# Patient Record
Sex: Female | Born: 2000 | Race: Asian | Hispanic: No | Marital: Single | Smoking: Never smoker
Health system: Southern US, Community
[De-identification: ages and names within clinical notes are randomized; demographics above are authoritative.]

## PROBLEM LIST (undated history)

## (undated) HISTORY — PX: WISDOM TOOTH EXTRACTION: SHX21

---
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
## 2022-04-10 ENCOUNTER — Emergency Department (HOSPITAL_BASED_OUTPATIENT_CLINIC_OR_DEPARTMENT_OTHER)
Admission: EM | Admit: 2022-04-10 | Discharge: 2022-04-10 | Disposition: A | Discharge status: Home / Self Care | Payer: Medicaid Other | Attending: Emergency Medicine | Admitting: Emergency Medicine

## 2022-04-10 ENCOUNTER — Other Ambulatory Visit: Payer: Self-pay

## 2022-04-10 ENCOUNTER — Emergency Department (HOSPITAL_BASED_OUTPATIENT_CLINIC_OR_DEPARTMENT_OTHER): Payer: Medicaid Other

## 2022-04-10 ENCOUNTER — Encounter (HOSPITAL_BASED_OUTPATIENT_CLINIC_OR_DEPARTMENT_OTHER): Payer: Self-pay | Admitting: Emergency Medicine

## 2022-04-10 DIAGNOSIS — R002 Palpitations: Secondary | ICD-10-CM | POA: Diagnosis not present

## 2022-04-10 DIAGNOSIS — R079 Chest pain, unspecified: Secondary | ICD-10-CM | POA: Diagnosis present

## 2022-04-10 DIAGNOSIS — R0789 Other chest pain: Secondary | ICD-10-CM | POA: Diagnosis not present

## 2022-04-10 NOTE — ED Provider Notes (Signed)
[facility] EMERGENCY DEPARTMENT Provider Note   CSN: [id number] Arrival date & time: 04/10/22  0805     History  Chief Complaint  Patient presents with   Chest Pain    Jessica Beltran is a 22 y.o. female.  Patient here with chest pain and palpitations.  History of the same.  History of anxiety.  Nothing makes it worse or better.  Denies any recent surgery or travel.  Not on birth controls pills.  Has already been seen by cardiology for issues like this in the past.  Has had negative stress test.  The history is provided by the patient.       Home Medications Prior to Admission medications   Medication Sig Start Date End Date Taking? Authorizing Provider  amitriptyline (ELAVIL) 10 MG tablet Take 1 tablet (10 mg total) by mouth at bedtime. 07/04/17 12/31/17  Kandis Ban, MD  dicyclomine (BENTYL) 20 MG tablet Take 1 tablet (20 mg total) by mouth every 12 (twelve) hours as needed (abdominal cramping). 01/09/16   Antonietta Breach, PA-C  famotidine (PEPCID) 10 MG tablet Take 1 tablet (10 mg total) by mouth daily. 10/11/21   Azucena Cecil, PA-C  ondansetron (ZOFRAN) 4 MG tablet Take 1 tablet (4 mg total) by mouth every 8 (eight) hours as needed for nausea or vomiting. 10/11/21   Azucena Cecil, PA-C  pantoprazole (PROTONIX) 40 MG tablet TK 1 T PO D 05/26/17   [provider]  polyethylene glycol powder (GLYCOLAX/MIRALAX) powder Take 17 g by mouth 2 (two) times daily. Until daily soft stools  OTC 01/09/16   Antonietta Breach, PA-C      Allergies    Beef-derived products    Review of Systems   Review of Systems  Physical Exam Updated Vital Signs BP 121/81 (BP Location: Right Arm)   Pulse 83   Temp 98.3 F (36.8 C) (Oral)   Resp 20   Wt 53.5 kg   LMP 03/07/2022   SpO2 100%   BMI 19.64 kg/m  Physical Exam Vitals and nursing note reviewed.  Constitutional:      General: She is not in acute distress.    Appearance: She is  well-developed. She is not ill-appearing.  HENT:     Head: Normocephalic and atraumatic.  Eyes:     Conjunctiva/sclera: Conjunctivae normal.     Pupils: Pupils are equal, round, and reactive to light.  Cardiovascular:     Rate and Rhythm: Normal rate and regular rhythm.     Pulses:          Radial pulses are 2+ on the right side and 2+ on the left side.     Heart sounds: Normal heart sounds. No murmur heard. Pulmonary:     Effort: Pulmonary effort is normal. No respiratory distress.     Breath sounds: Normal breath sounds.  Abdominal:     Palpations: Abdomen is soft.     Tenderness: There is no abdominal tenderness.  Musculoskeletal:        General: No swelling.     Cervical back: Normal range of motion and neck supple.  Skin:    General: Skin is warm and dry.     Capillary Refill: Capillary refill takes less than 2 seconds.  Neurological:     Mental Status: She is alert.  Psychiatric:        Mood and Affect: Mood normal.     ED Results / Procedures / Treatments   Labs (all labs ordered  are listed, but only abnormal results are displayed) Labs Reviewed - No data to display  EKG EKG Interpretation  Date/Time:  Saturday April 10 2022 08:19:37 EST Ventricular Rate:  83 PR Interval:  160 QRS Duration: 80 QT Interval:  348 QTC Calculation: 409 R Axis:   86 Text Interpretation: Sinus rhythm Confirmed by Lennice Sites ([id number]) on 04/10/2022 8:39:50 AM  Radiology DG Chest 2 View  Result Date: 04/10/2022 CLINICAL DATA:  Chest pain EXAM: CHEST - 2 VIEW COMPARISON:  Chest x-ray May 02, 2014 FINDINGS: Mild pectus excavatum. The heart, hila, mediastinum, lungs, and pleura are otherwise unremarkable. No other bony or soft tissue abnormalities are identified. IMPRESSION: No active cardiopulmonary disease. Electronically Signed   By: Dorise Bullion III M.D.   On: 04/10/2022 08:40    Procedures Procedures    Medications Ordered in ED Medications - No data to display  ED  Course/ Medical Decision Making/ A&P                             Medical Decision Making Amount and/or Complexity of Data Reviewed Radiology: ordered.   Modest Draeger is here with chest pain and palpitations and anxiety.  Normal vitals.  No fever.  EKG per my review interpretation shows sinus rhythm.  No ischemic changes.  Chest x-ray shows no evidence of pneumonia or pneumothorax per my review and interpretation.  Patient is heart score 0 and doubt ACS.  PERC negative and doubt PE.  No infectious symptoms.  Overall appears very well.  She has been seen by cardiology in the past for this.  Had unremarkable echocardiogram as well.  My suspicion is that this is anxiety and stress related.  Could be acid reflux related.  Discharged in good condition.  Recommend follow-up with primary care doctor.  Given resources for mental health follow-up as well.  This chart was dictated using voice recognition software.  Despite best efforts to proofread,  errors can occur which can change the documentation meaning.         Final Clinical Impression(s) / ED Diagnoses Final diagnoses:  Atypical chest pain    Rx / DC Orders ED Discharge Orders     None         Lennice Sites, DO 04/10/22 [id number]

## 2022-04-10 NOTE — ED Triage Notes (Signed)
Pt arrives pov, steady gait, c/o non-radiating LT side CP x 2 weeks. Endorses emesis yesterday. Denies shob or cough

## 2022-08-04 IMAGING — US US PELVIS COMPLETE
2 series · 13 of 25 positions shown · non-contrast
Comparison: None.

CLINICAL DATA: Right lower quadrant abdominal pain with nausea and
vomiting x1 day.

EXAM:
TRANSABDOMINAL ULTRASOUND OF PELVIS
DOPPLER ULTRASOUND OF OVARIES
TECHNIQUE: Transabdominal ultrasound examination of the pelvis was performed
including evaluation of the uterus, ovaries, adnexal regions, and
pelvic cul-de-sac.
Color and duplex Doppler ultrasound was utilized to evaluate blood
flow to the ovaries.

[Series 1: us pelvis complete · 70 acquisitions, 12 frames shown (1 of 2)]
[im 1/70]
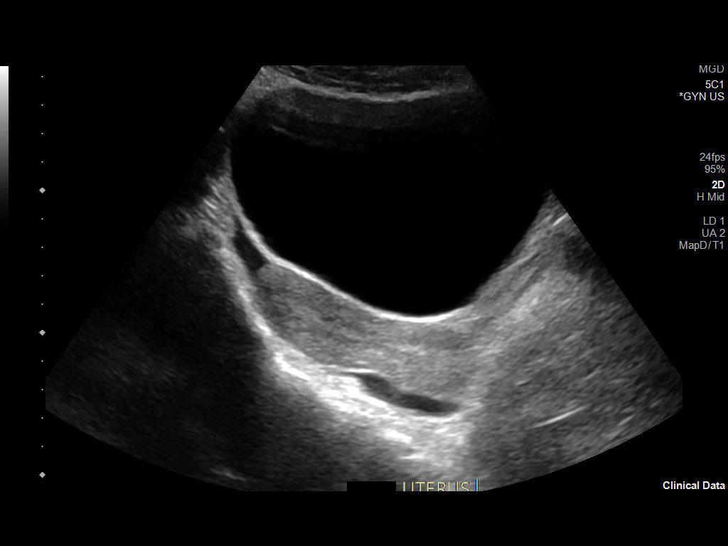
[im 7/70]
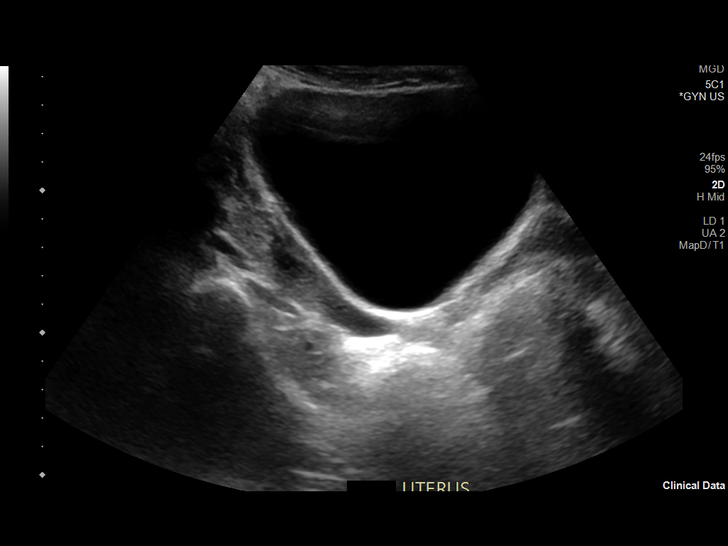
[im 13/70]
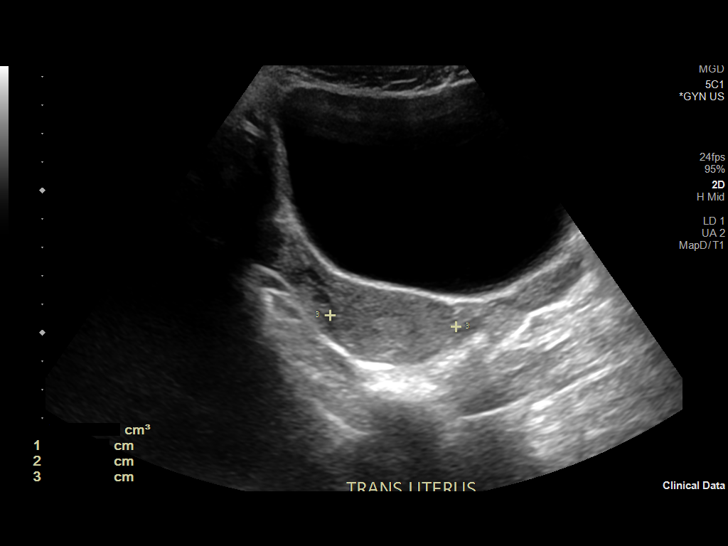
[im 19/70]
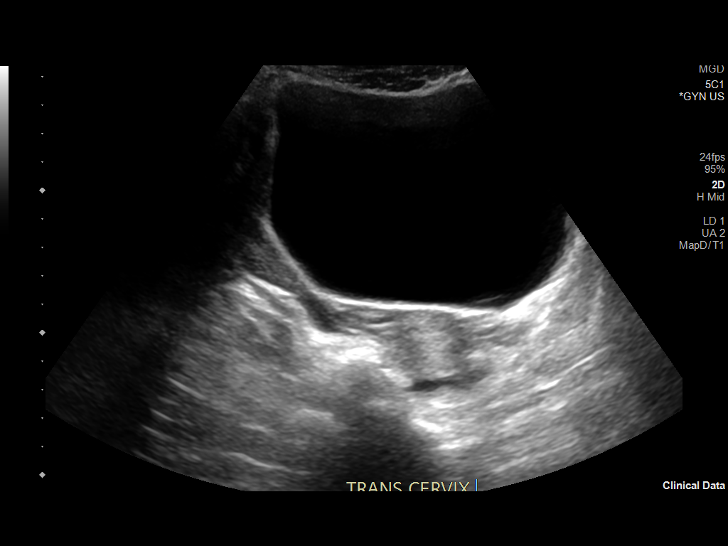
[im 25/70]
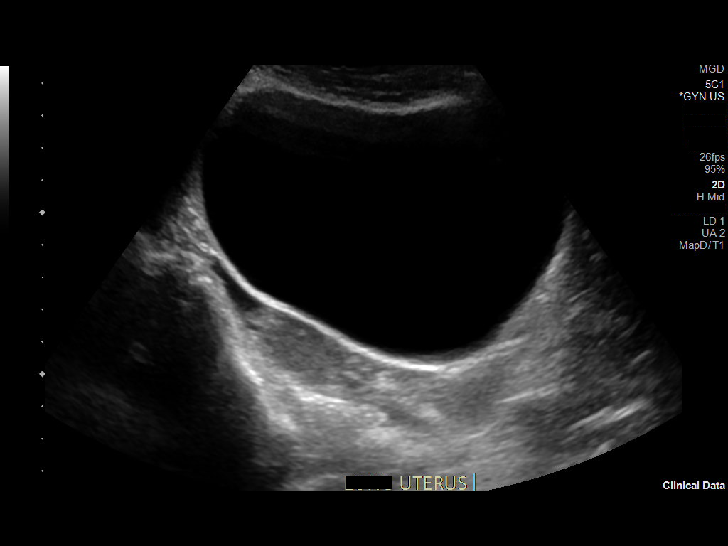
[im 31/70]
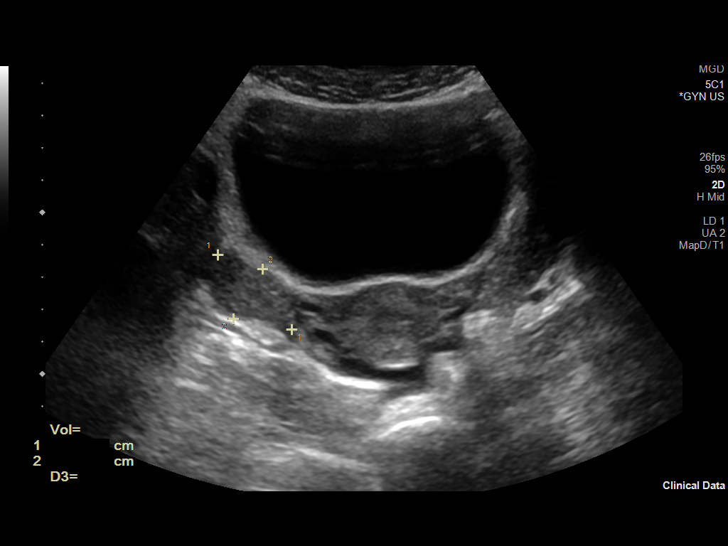
[im 37/70]
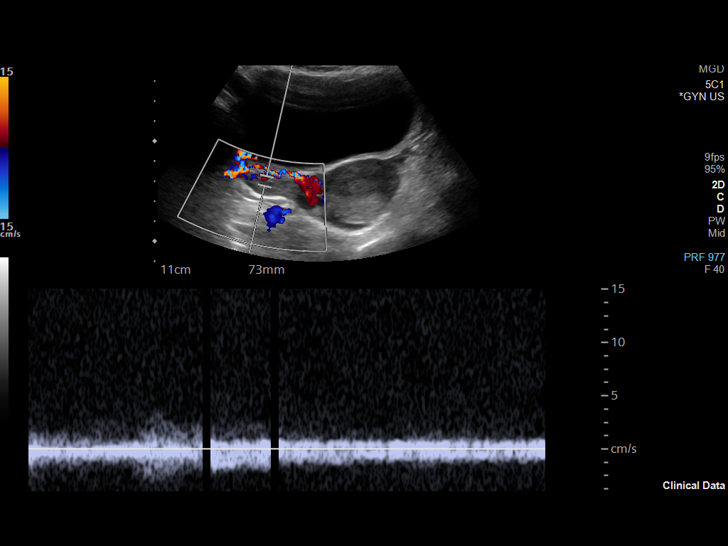
[im 43/70]
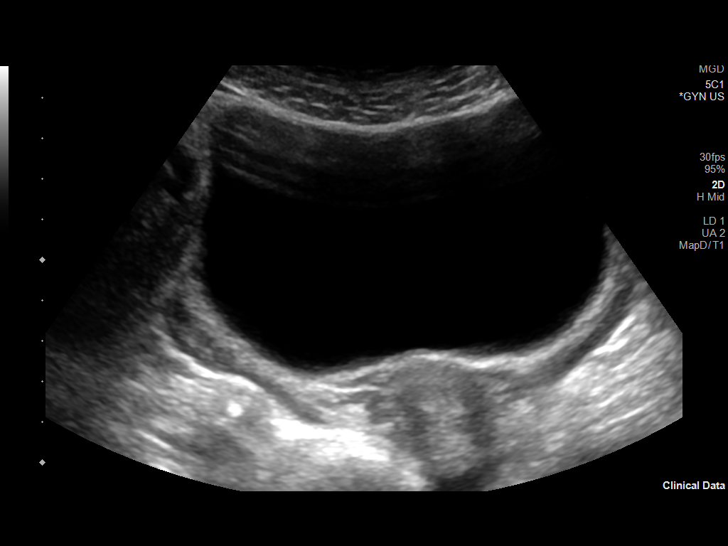
[im 49/70]
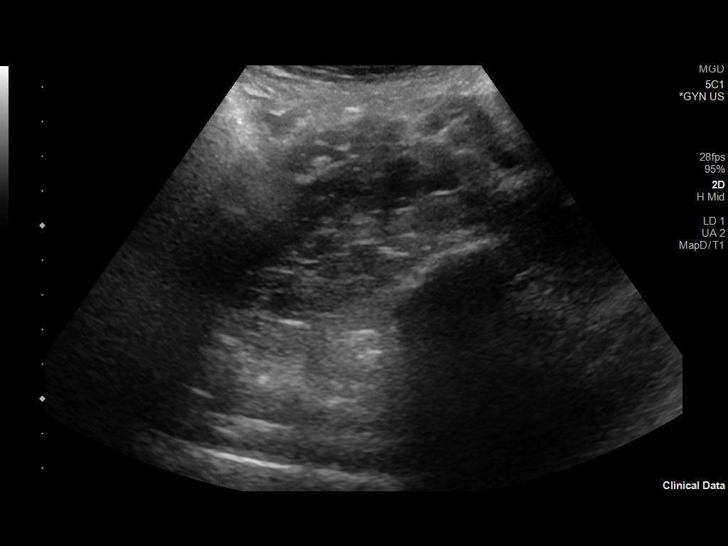
[im 55/70]
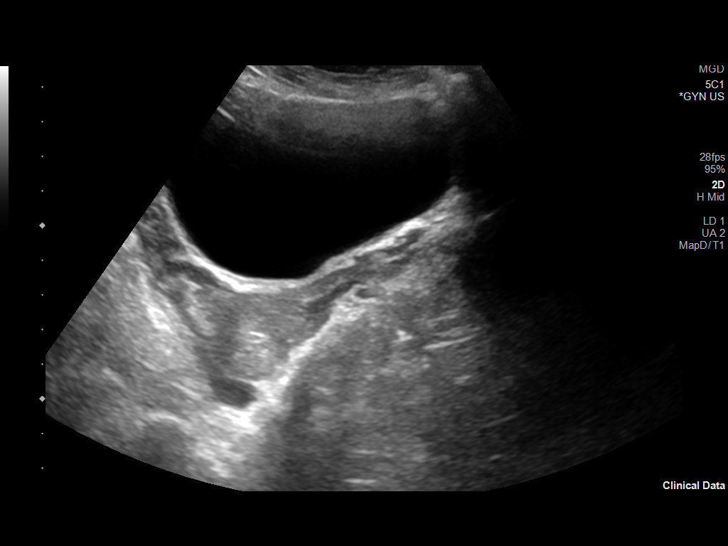
[im 61/70]
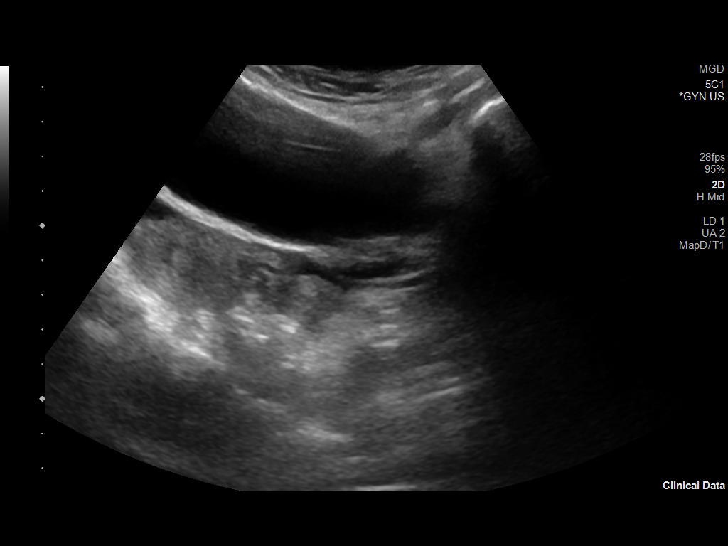
[im 67/70]
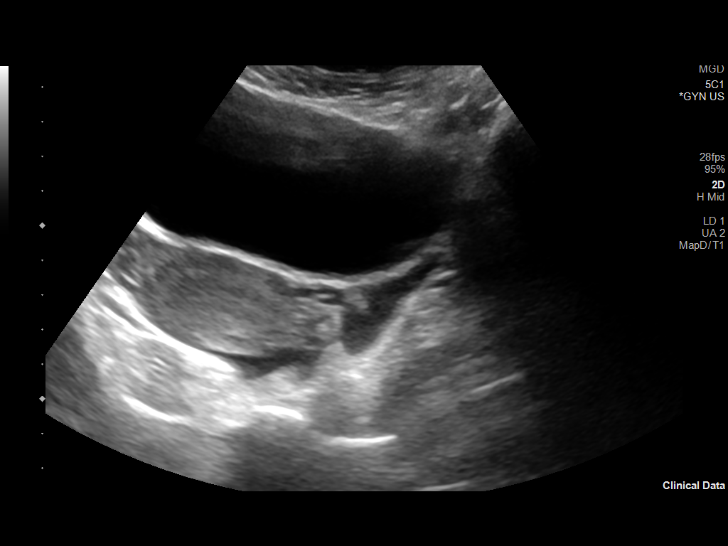

[Series 2: us pelvis complete · 1 of 1 slices shown (2 of 2)]
[im 1/1]
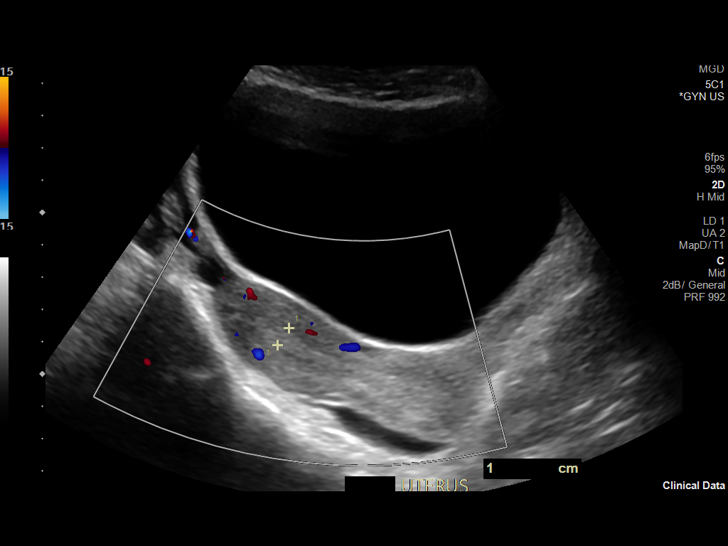

[13 of 25 positions shown; findings below may reference images not displayed]

FINDINGS: Uterus

Measurements: 9.0 cm x 2.4 cm x 4.4 cm = volume: 52.2 mL. No
fibroids or other mass visualized.

Endometrium

Thickness: 6 mm.  No focal abnormality visualized.

Right ovary

Measurements: 3.2 cm x 1.7 cm x 3.3 cm = volume: 9.5 mL. Normal
appearance/no adnexal mass.

Left ovary

The left ovary is not visualized.

Pulsed Doppler evaluation demonstrates normal low-resistance
arterial and venous waveforms in the RIGHT ovary.

Other: A moderate amount of pelvic free fluid is seen.
IMPRESSION: 1. Normal ultrasonographic appearance of the uterus and right ovary,
with nonvisualization of the left ovary.
2. Normal right ovarian flow.
3. Moderate amount of pelvic free fluid.

## 2022-08-04 IMAGING — US US ABDOMEN LIMITED
1 series · 14 of 17 positions shown · non-contrast
Comparison: None.

CLINICAL DATA: Right lower quadrant abdominal pain with nausea and
vomiting x1 day.

EXAM:
ULTRASOUND ABDOMEN LIMITED
TECHNIQUE: Gray scale imaging of the right lower quadrant was performed to
evaluate for suspected appendicitis. Standard imaging planes and
graded compression technique were utilized.

[Series 1: us abdomen limited · 17 acquisitions, 14 frames shown]
[im 1/17]
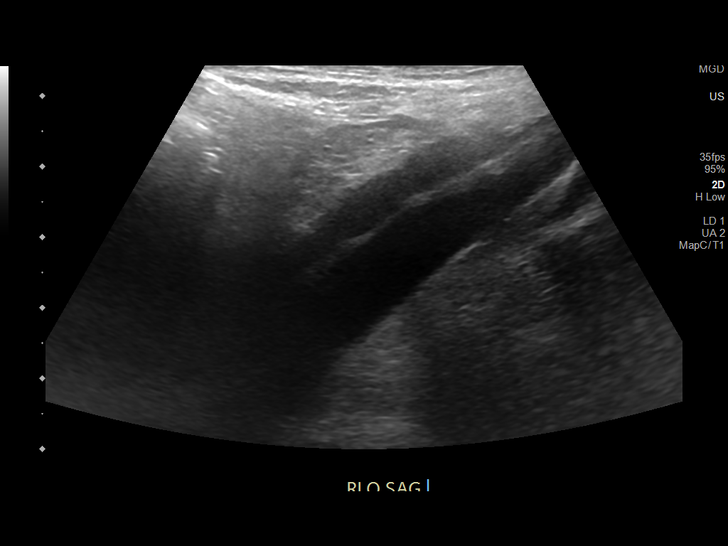
[im 2/17]
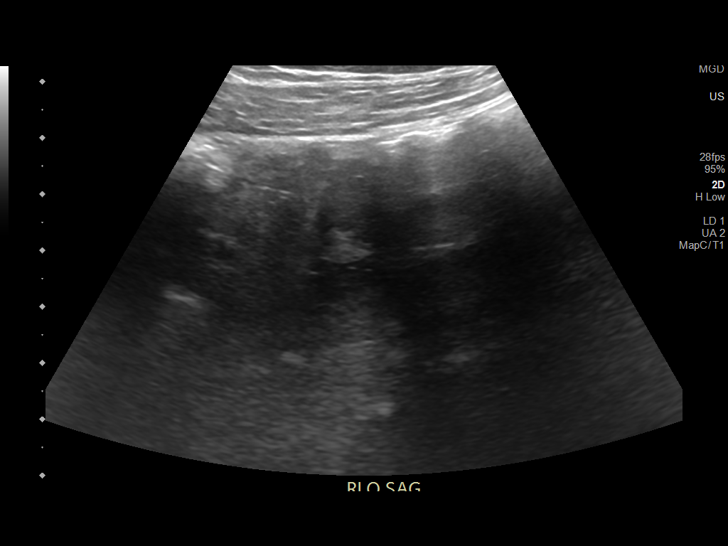
[im 4/17]
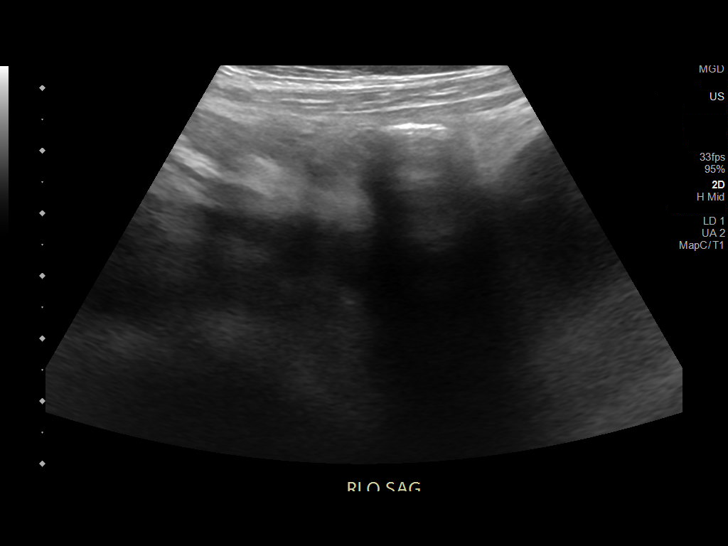
[im 5/17]
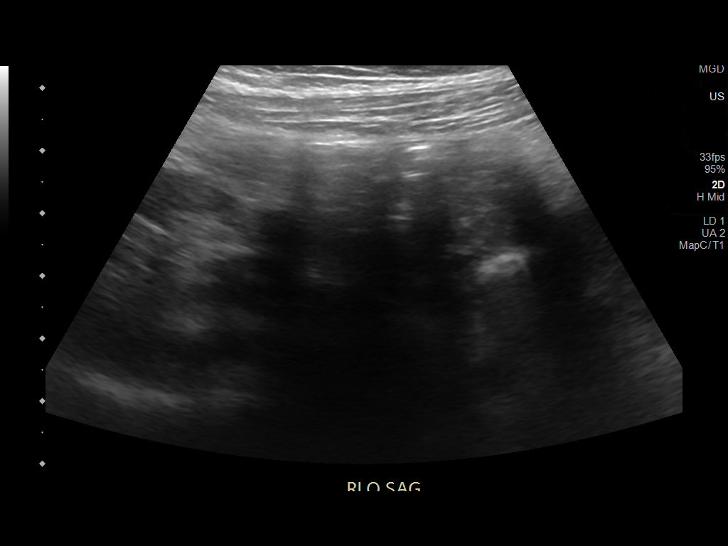
[im 6/17]
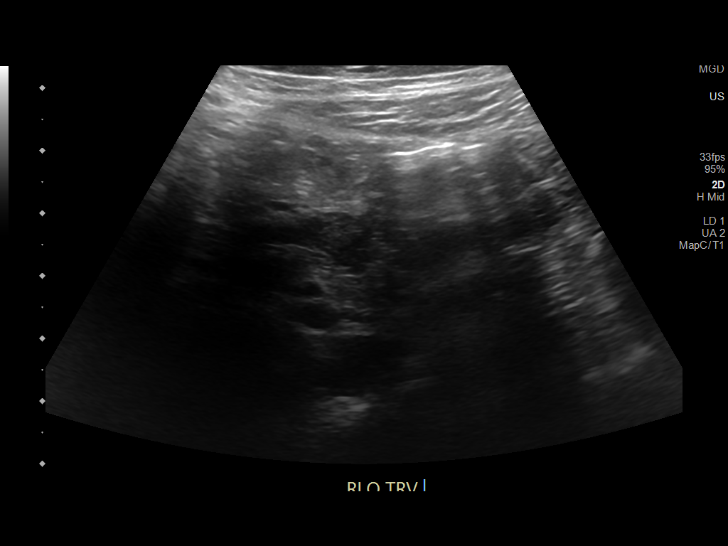
[im 7/17]
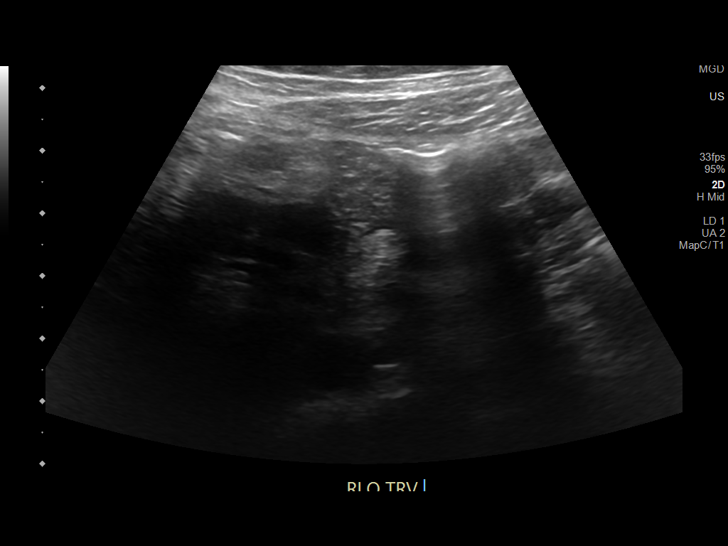
[im 8/17]
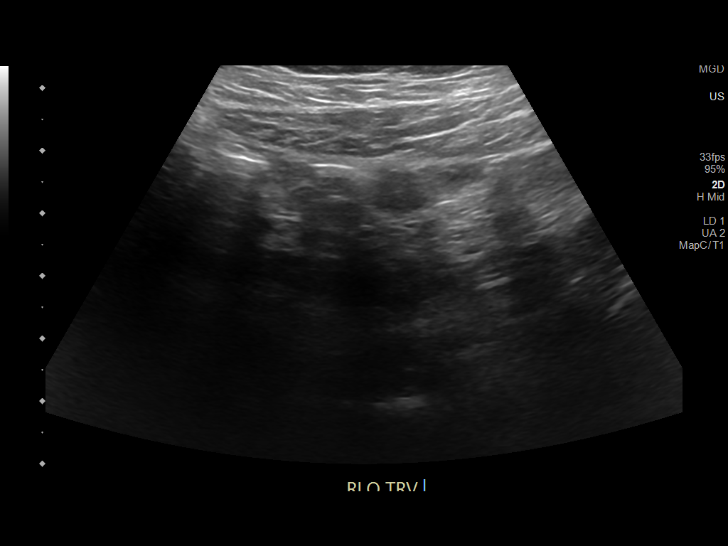
[im 10/17]
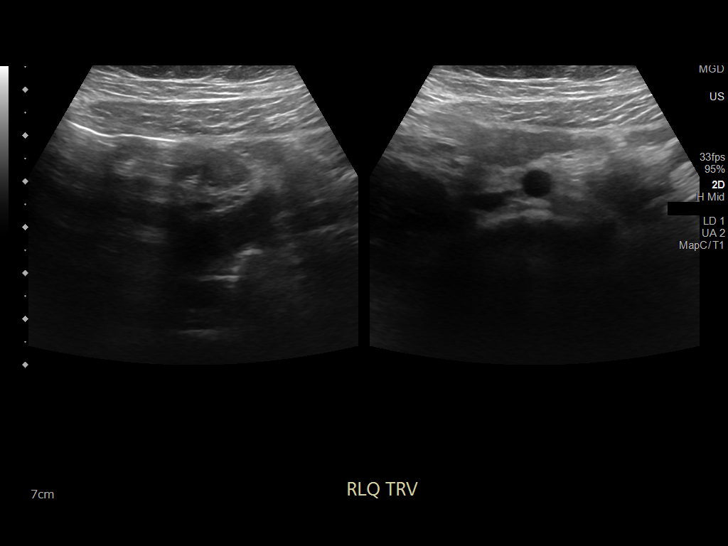
[im 11/17]
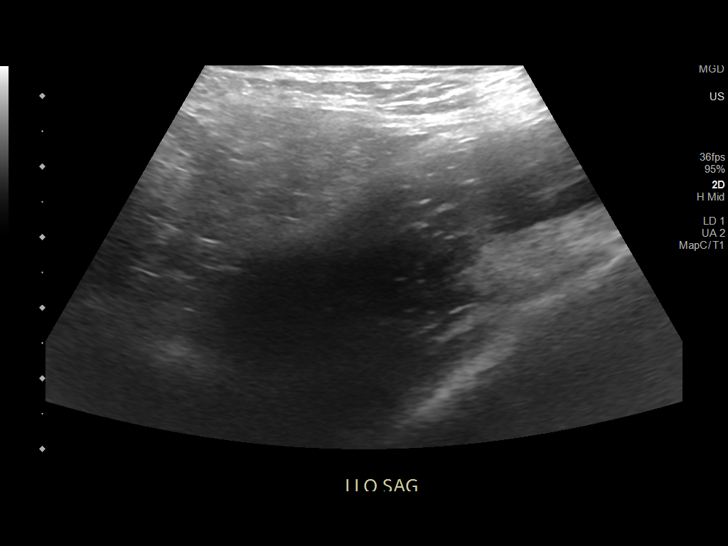
[im 12/17]
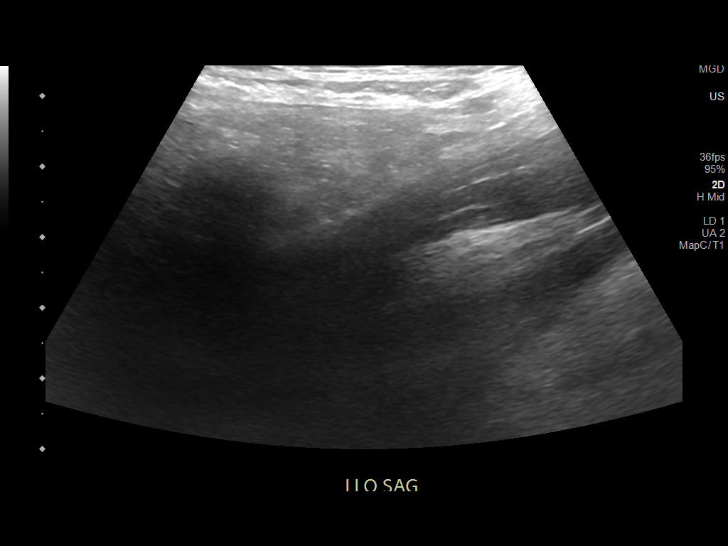
[im 13/17]
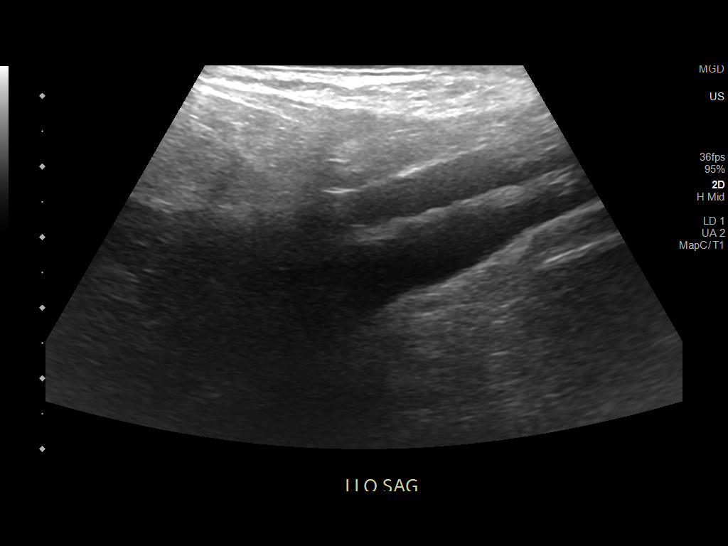
[im 14/17]
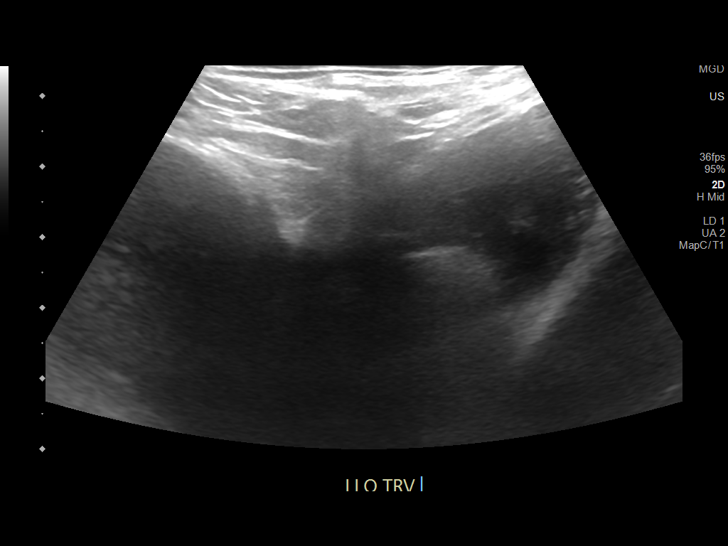
[im 16/17]
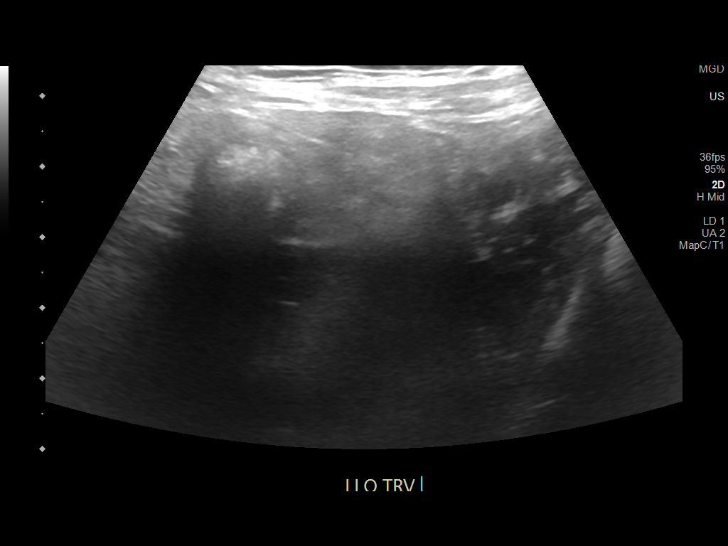
[im 17/17]
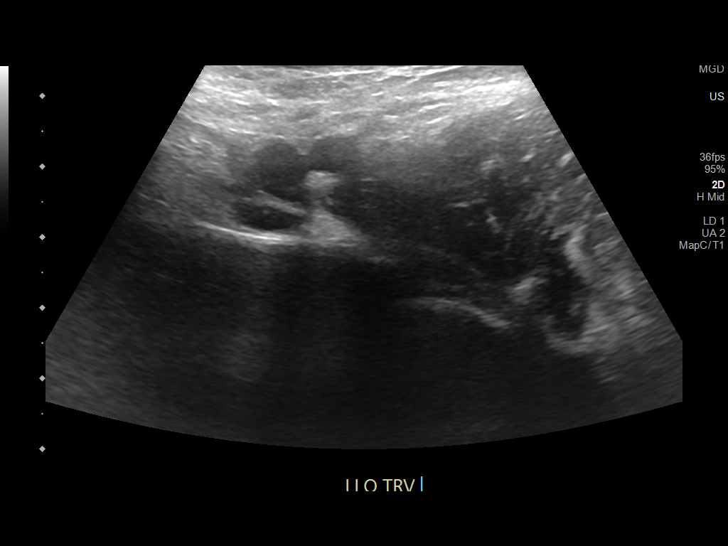

[14 of 17 positions shown; findings below may reference images not displayed]

FINDINGS: The appendix is not visualized.

Ancillary findings: None.

Factors affecting image quality: Overlying bowel gas.

Other findings: Pelvic free fluid is noted.
IMPRESSION: 1. Non visualization of the appendix. Non-visualization of appendix
by US does not definitely exclude appendicitis. If there is
sufficient clinical concern, consider abdomen pelvis CT with
contrast for further evaluation.
2. Pelvic free fluid.

## 2022-11-03 ENCOUNTER — Encounter (HOSPITAL_BASED_OUTPATIENT_CLINIC_OR_DEPARTMENT_OTHER): Payer: Self-pay

## 2022-11-03 ENCOUNTER — Other Ambulatory Visit: Payer: Self-pay

## 2022-11-03 DIAGNOSIS — M791 Myalgia, unspecified site: Secondary | ICD-10-CM | POA: Insufficient documentation

## 2022-11-03 DIAGNOSIS — R110 Nausea: Secondary | ICD-10-CM | POA: Diagnosis not present

## 2022-11-03 DIAGNOSIS — R059 Cough, unspecified: Secondary | ICD-10-CM | POA: Diagnosis present

## 2022-11-03 DIAGNOSIS — Z20822 Contact with and (suspected) exposure to covid-19: Secondary | ICD-10-CM | POA: Insufficient documentation

## 2022-11-03 DIAGNOSIS — J069 Acute upper respiratory infection, unspecified: Secondary | ICD-10-CM | POA: Diagnosis not present

## 2022-11-03 DIAGNOSIS — R7309 Other abnormal glucose: Secondary | ICD-10-CM | POA: Insufficient documentation

## 2022-11-03 LAB — RESP PANEL BY RT-PCR (RSV, FLU A&B, COVID)  RVPGX2
Influenza A by PCR: NEGATIVE
Influenza B by PCR: NEGATIVE
Resp Syncytial Virus by PCR: NEGATIVE
SARS Coronavirus 2 by RT PCR: NEGATIVE

## 2022-11-03 NOTE — ED Triage Notes (Signed)
Pt c/o cough, HA x 2 weeks and left side of neck swelling Was seen at [facility] yesterday and prescribed  cough syrup & Prednisone Was not tested for covid

## 2022-11-04 ENCOUNTER — Emergency Department (HOSPITAL_BASED_OUTPATIENT_CLINIC_OR_DEPARTMENT_OTHER)
Admission: EM | Admit: 2022-11-04 | Discharge: 2022-11-04 | Disposition: A | Discharge status: Home / Self Care | Payer: Medicaid Other | Attending: Emergency Medicine | Admitting: Emergency Medicine

## 2022-11-04 DIAGNOSIS — J069 Acute upper respiratory infection, unspecified: Secondary | ICD-10-CM

## 2022-11-04 DIAGNOSIS — R7309 Other abnormal glucose: Secondary | ICD-10-CM

## 2022-11-04 LAB — CBC WITH DIFFERENTIAL/PLATELET
Abs Immature Granulocytes: 0.03 10*3/uL (ref 0.00–0.07)
Basophils Absolute: 0 10*3/uL (ref 0.0–0.1)
Basophils Relative: 0 %
Eosinophils Absolute: 0.2 10*3/uL (ref 0.0–0.5)
Eosinophils Relative: 2 %
HCT: 38.8 % (ref 36.0–46.0)
Hemoglobin: 13.1 g/dL (ref 12.0–15.0)
Immature Granulocytes: 0 %
Lymphocytes Relative: 31 %
Lymphs Abs: 2.9 10*3/uL (ref 0.7–4.0)
MCH: 30.8 pg (ref 26.0–34.0)
MCHC: 33.8 g/dL (ref 30.0–36.0)
MCV: 91.1 fL (ref 80.0–100.0)
Monocytes Absolute: 0.7 10*3/uL (ref 0.1–1.0)
Monocytes Relative: 7 %
Neutro Abs: 5.4 10*3/uL (ref 1.7–7.7)
Neutrophils Relative %: 60 %
Platelets: 271 10*3/uL (ref 150–400)
RBC: 4.26 MIL/uL (ref 3.87–5.11)
RDW: 13.4 % (ref 11.5–15.5)
WBC: 9.2 10*3/uL (ref 4.0–10.5)
nRBC: 0 % (ref 0.0–0.2)

## 2022-11-04 LAB — I-STAT CHEM 8, ED
BUN: 10 mg/dL (ref 6–20)
Calcium, Ion: 1.25 mmol/L (ref 1.15–1.40)
Chloride: 102 mmol/L (ref 98–111)
Creatinine, Ser: 0.7 mg/dL (ref 0.44–1.00)
Glucose, Bld: 142 mg/dL — ABNORMAL HIGH (ref 70–99)
HCT: 38 % (ref 36.0–46.0)
Hemoglobin: 12.9 g/dL (ref 12.0–15.0)
Potassium: 3.2 mmol/L — ABNORMAL LOW (ref 3.5–5.1)
Sodium: 138 mmol/L (ref 135–145)
TCO2: 25 mmol/L (ref 22–32)

## 2022-11-04 LAB — HCG, QUANTITATIVE, PREGNANCY: hCG, Beta Chain, Quant, S: <1 m[IU]/mL (ref <5)

## 2022-11-04 LAB — MONONUCLEOSIS SCREEN: Mono Screen: NEGATIVE

## 2022-11-04 MED ORDER — PREDNISONE 50 MG PO TABS
60.0000 mg | ORAL_TABLET | Freq: Once | ORAL | Status: AC
  Administered 2022-11-04: 60 mg via ORAL
  Filled 2022-11-04: qty 1

## 2022-11-04 MED ORDER — PREDNISONE 50 MG PO TABS: 50.0000 mg | ORAL_TABLET | Freq: Every day | ORAL | 0 refills | Status: DC

## 2022-11-04 MED ORDER — ONDANSETRON HCL 4 MG/2ML IJ SOLN
4.0000 mg | Freq: Once | INTRAMUSCULAR | Status: AC
  Administered 2022-11-04: 4 mg via INTRAVENOUS
  Filled 2022-11-04: qty 2

## 2022-11-04 MED ORDER — IPRATROPIUM-ALBUTEROL 0.5-2.5 (3) MG/3ML IN SOLN
3.0000 mL | Freq: Once | RESPIRATORY_TRACT | Status: AC
  Administered 2022-11-04: 3 mL via RESPIRATORY_TRACT
  Filled 2022-11-04: qty 3

## 2022-11-04 MED ORDER — SODIUM CHLORIDE 0.9 % IV BOLUS
1000.0000 mL | Freq: Once | INTRAVENOUS | Status: AC
  Administered 2022-11-04: 1000 mL via INTRAVENOUS

## 2022-11-04 MED ORDER — ALBUTEROL SULFATE HFA 108 (90 BASE) MCG/ACT IN AERS
2.0000 | INHALATION_SPRAY | RESPIRATORY_TRACT | Status: DC | PRN
  Administered 2022-11-04: 2 via RESPIRATORY_TRACT
  Filled 2022-11-04: qty 6.7

## 2022-11-04 MED ORDER — KETOROLAC TROMETHAMINE 30 MG/ML IJ SOLN
30.0000 mg | Freq: Once | INTRAMUSCULAR | Status: AC
  Administered 2022-11-04: 30 mg via INTRAVENOUS
  Filled 2022-11-04: qty 1

## 2022-11-04 MED ORDER — ALBUTEROL SULFATE HFA 108 (90 BASE) MCG/ACT IN AERS: 2.0000 | INHALATION_SPRAY | RESPIRATORY_TRACT | Status: AC | PRN

## 2022-11-04 NOTE — ED Provider Notes (Signed)
[place] [facility] Provider Note   CSN: [id number] Arrival date & time: 11/03/22  2044     History  Chief Complaint  Patient presents with   Cough    Jessica Beltran is a 22 y.o. female.  The history is provided by the patient.  Cough She has been sick for 2 weeks with sore throat, cough productive of some clear sputum, nausea, body aches.  She also states that she is feeling very fatigued.  She has tried taking NyQuil and DayQuil without relief.  She went to an urgent care center yesterday and was given some cough medicine which has not helped.  She denies any vomiting or diarrhea.  She has occasionally had some mild dyspnea.  Today, she noted that there was some swelling on the left side of her neck, but that has subsided.  She denies any sick contacts.   Home Medications Prior to Admission medications   Medication Sig Start Date End Date Taking? Authorizing Provider  amitriptyline (ELAVIL) 10 MG tablet Take 1 tablet (10 mg total) by mouth at bedtime. 07/04/17 12/31/17  Salem Senate, MD  dicyclomine (BENTYL) 20 MG tablet Take 1 tablet (20 mg total) by mouth every 12 (twelve) hours as needed (abdominal cramping). 01/09/16   Antony Madura, PA-C  famotidine (PEPCID) 10 MG tablet Take 1 tablet (10 mg total) by mouth daily. 10/11/21   Al Decant, PA-C  ondansetron (ZOFRAN) 4 MG tablet Take 1 tablet (4 mg total) by mouth every 8 (eight) hours as needed for nausea or vomiting. 10/11/21   Al Decant, PA-C  pantoprazole (PROTONIX) 40 MG tablet TK 1 T PO D 05/26/17   [provider]  polyethylene glycol powder (GLYCOLAX/MIRALAX) powder Take 17 g by mouth 2 (two) times daily. Until daily soft stools  OTC 01/09/16   Antony Madura, PA-C      Allergies    Beef-derived products    Review of Systems   Review of Systems  Respiratory:  Positive for cough.   All other systems reviewed and are negative.   Physical  Exam Updated Vital Signs BP 104/78 (BP Location: Left Arm)   Pulse 83   Temp 97.8 F (36.6 C) (Oral)   Resp 16   Ht 5\' 5"  (1.651 m)   Wt 56.7 kg   LMP 09/27/2022 (Exact Date)   SpO2 94%   BMI 20.80 kg/m  Physical Exam Vitals and nursing note reviewed.   22 year old female, resting comfortably and in no acute distress. Vital signs are normal. Oxygen saturation is 99%, which is normal. Head is normocephalic and atraumatic. PERRLA, EOMI. Oropharynx is clear. Neck is nontender and supple with shotty bilateral posterior cervical adenopathy. Back is nontender and there is no CVA tenderness. Lungs are clear without rales, wheezes, or rhonchi.  There is a slightly prolonged exhalation phase. Chest is nontender. Heart has regular rate and rhythm without murmur. Abdomen is soft, flat, nontender. Extremities have no cyanosis or edema, full range of motion is present. Skin is warm and dry without rash. Neurologic: Mental status is normal, cranial nerves are intact, moves all extremities equally.  ED Results / Procedures / Treatments   Labs (all labs ordered are listed, but only abnormal results are displayed) Labs Reviewed  I-STAT CHEM 8, ED - Abnormal; Notable for the following components:      Result Value   Potassium 3.2 (*)    Glucose, Bld 142 (*)    All  other components within normal limits  RESP PANEL BY RT-PCR (RSV, FLU A&B, COVID)  RVPGX2  CBC WITH DIFFERENTIAL/PLATELET  MONONUCLEOSIS SCREEN  HCG, QUANTITATIVE, PREGNANCY   Procedures Procedures    Medications Ordered in ED Medications  albuterol (VENTOLIN HFA) 108 (90 Base) MCG/ACT inhaler 2 puff (2 puffs Inhalation Given 11/04/22 0154)  sodium chloride 0.9 % bolus 1,000 mL (0 mLs Intravenous Stopped 11/04/22 0143)  ondansetron (ZOFRAN) injection 4 mg (4 mg Intravenous Given 11/04/22 0045)  ketorolac (TORADOL) 30 MG/ML injection 30 mg (30 mg Intravenous Given 11/04/22 0043)  ipratropium-albuterol (DUONEB) 0.5-2.5 (3) MG/3ML  nebulizer solution 3 mL (3 mLs Nebulization Given 11/04/22 0037)  predniSONE (DELTASONE) tablet 60 mg (60 mg Oral Given 11/04/22 0156)    ED Course/ Medical Decision Making/ A&P                                 Medical Decision Making Amount and/or Complexity of Data Reviewed Labs: ordered.  Risk Prescription drug management.   Cough, sore throat, body aches suggesting viral illness.  I do not see any red flags to suggest more serious pathology.  With prolonged illness and prominent fatigue, I am wondering if she could have infectious mononucleosis even though the there are no findings on throat exam.  I am ordering some IV fluids, ondansetron for nausea, ketorolac for body aches and I am also ordering a therapeutic trial of albuterol with ipratropium to see if it helps with her cough.  I am ordering laboratory tests of CBC, i-STAT Chem-8, and mononucleosis screen.  (Chemistry panels are not available currently),  She feels much better after above-noted treatment.  I have reviewed and interpreted her laboratory tests, and my interpretation is normal CBC, elevated random glucose which will need to be followed as an outpatient.  Mild hypokalemia is noted, but this is on an i-STAT which is not reliable and I am not initiating treatment based on that.  I have ordered a dose of prednisone.  I am giving her an albuterol inhaler to take home to use as needed.  I am giving her a prescription for a 5-day course of prednisone.  I have recommended that she use over-the-counter NSAIDs and acetaminophen as needed.  Final Clinical Impression(s) / ED Diagnoses Final diagnoses:  Viral upper respiratory tract infection  Elevated random blood glucose level    Rx / DC Orders ED Discharge Orders          Ordered    predniSONE (DELTASONE) 50 MG tablet  Daily        11/04/22 0149    albuterol (VENTOLIN HFA) 108 (90 Base) MCG/ACT inhaler  Every 4 hours PRN        11/04/22 0149              Dione Booze, MD 11/04/22 [phone]

## 2022-11-04 NOTE — Discharge Instructions (Signed)
Drink plenty of fluids.  Take ibuprofen or naproxen as needed for aching.  If you need additional pain relief, add acetaminophen.  Use the inhaler as needed for your coughing.  When you use the inhaler, take 2 puffs at a time.  This can be done as often as every 4-6 hours as needed.

## 2023-08-26 ENCOUNTER — Encounter (HOSPITAL_BASED_OUTPATIENT_CLINIC_OR_DEPARTMENT_OTHER): Payer: Self-pay

## 2023-08-26 ENCOUNTER — Emergency Department (HOSPITAL_BASED_OUTPATIENT_CLINIC_OR_DEPARTMENT_OTHER)
Admission: EM | Admit: 2023-08-26 | Discharge: 2023-08-26 | Disposition: A | Discharge status: Home / Self Care | Attending: Emergency Medicine | Admitting: Emergency Medicine

## 2023-08-26 ENCOUNTER — Other Ambulatory Visit: Payer: Self-pay

## 2023-08-26 DIAGNOSIS — R519 Headache, unspecified: Secondary | ICD-10-CM | POA: Insufficient documentation

## 2023-08-26 DIAGNOSIS — R102 Pelvic and perineal pain: Secondary | ICD-10-CM | POA: Diagnosis present

## 2023-08-26 LAB — COMPREHENSIVE METABOLIC PANEL WITH GFR
ALT: 11 U/L (ref 0–44)
AST: 17 U/L (ref 15–41)
Albumin: 4.5 g/dL (ref 3.5–5.0)
Alkaline Phosphatase: 54 U/L (ref 38–126)
Anion gap: 10 (ref 5–15)
BUN: 10 mg/dL (ref 6–20)
CO2: 26 mmol/L (ref 22–32)
Calcium: 9.7 mg/dL (ref 8.9–10.3)
Chloride: 103 mmol/L (ref 98–111)
Creatinine, Ser: 0.7 mg/dL (ref 0.44–1.00)
GFR, Estimated: >60 mL/min (ref >60)
Glucose, Bld: 100 mg/dL — ABNORMAL HIGH (ref 70–99)
Potassium: 3.8 mmol/L (ref 3.5–5.1)
Sodium: 140 mmol/L (ref 135–145)
Total Bilirubin: 0.5 mg/dL (ref 0.0–1.2)
Total Protein: 7.5 g/dL (ref 6.5–8.1)

## 2023-08-26 LAB — CBC WITH DIFFERENTIAL/PLATELET
Abs Immature Granulocytes: 0.02 10*3/uL (ref 0.00–0.07)
Basophils Absolute: 0 10*3/uL (ref 0.0–0.1)
Basophils Relative: 0 %
Eosinophils Absolute: 0.1 10*3/uL (ref 0.0–0.5)
Eosinophils Relative: 1 %
HCT: 40.9 % (ref 36.0–46.0)
Hemoglobin: 13.9 g/dL (ref 12.0–15.0)
Immature Granulocytes: 0 %
Lymphocytes Relative: 29 %
Lymphs Abs: 1.7 10*3/uL (ref 0.7–4.0)
MCH: 31.2 pg (ref 26.0–34.0)
MCHC: 34 g/dL (ref 30.0–36.0)
MCV: 91.9 fL (ref 80.0–100.0)
Monocytes Absolute: 0.4 10*3/uL (ref 0.1–1.0)
Monocytes Relative: 6 %
Neutro Abs: 3.7 10*3/uL (ref 1.7–7.7)
Neutrophils Relative %: 64 %
Platelets: 265 10*3/uL (ref 150–400)
RBC: 4.45 MIL/uL (ref 3.87–5.11)
RDW: 13.2 % (ref 11.5–15.5)
WBC: 5.9 10*3/uL (ref 4.0–10.5)
nRBC: 0 % (ref 0.0–0.2)

## 2023-08-26 LAB — URINALYSIS, W/ REFLEX TO CULTURE (INFECTION SUSPECTED)
Bilirubin Urine: NEGATIVE
Glucose, UA: NEGATIVE mg/dL
Ketones, ur: NEGATIVE mg/dL
Nitrite: NEGATIVE
Protein, ur: NEGATIVE mg/dL
Specific Gravity, Urine: 1.025 (ref 1.005–1.030)
pH: 8.5 — ABNORMAL HIGH (ref 5.0–8.0)

## 2023-08-26 LAB — MAGNESIUM: Magnesium: 2.3 mg/dL (ref 1.7–2.4)

## 2023-08-26 LAB — HCG, SERUM, QUALITATIVE: Preg, Serum: NEGATIVE

## 2023-08-26 MED ORDER — KETOROLAC TROMETHAMINE 30 MG/ML IJ SOLN
15.0000 mg | Freq: Once | INTRAMUSCULAR | Status: AC
  Administered 2023-08-26: 15 mg via INTRAVENOUS
  Filled 2023-08-26: qty 1

## 2023-08-26 MED ORDER — METOCLOPRAMIDE HCL 5 MG/ML IJ SOLN
10.0000 mg | Freq: Once | INTRAMUSCULAR | Status: AC
  Administered 2023-08-26: 10 mg via INTRAVENOUS
  Filled 2023-08-26: qty 2

## 2023-08-26 MED ORDER — SODIUM CHLORIDE 0.9 % IV BOLUS
1000.0000 mL | Freq: Once | INTRAVENOUS | Status: AC
  Administered 2023-08-26: 1000 mL via INTRAVENOUS

## 2023-08-26 NOTE — ED Triage Notes (Signed)
 Pt has severe headache, dizziness, nausea, vomiting, fatigue, body aches. Pt also states that starting 3 weeks ago she went to her PCP for abdominal pain. Pt was prescribed a medication that she took without relief. Yesterday had some chest pain as well.   Anastacio Balm, RN

## 2023-08-26 NOTE — ED Notes (Signed)
 Lab aware of urine culture.

## 2023-08-26 NOTE — Discharge Instructions (Addendum)
 You were seen in the emergency department for headache and pelvic pain Your pregnancy test was negative Your blood work looked okay and your urine test was sent for urine culture to see if there is a urinary tract infection Given that you are not having any urinary symptoms we did not start you on antibiotics but she may need to start antibiotics if the culture shows bacterial growth Your symptoms are most likely being caused by PCOS and for this reason you should follow-up with your OB/GYN for reevaluation Return to the emergency department for severe pain or any other concerns Continue taking Tylenol  Motrin  as directed for pain discomfort

## 2023-08-26 NOTE — ED Provider Notes (Signed)
 [place] [facility] Provider Note   CSN: [id number] Arrival date & time: 08/26/23  1156     History  Chief Complaint  Patient presents with   Headache    Jessica Beltran is a 23 y.o. female.  With a history of PCOS who presents to the ED for headache and pelvic pain.  She has experienced over 2 weeks of ongoing lower pelvic discomfort with headaches.  This pain preceded her menstrual period and was worsened during the menstrual period with cramping.  She has had similar pelvic pain before related to PCOS.  Outside of her normal menorrhea, no vaginal bleeding, vaginal discharge or urinary symptoms.  Headache feels similar to prior headaches localized over the frontal region.  Also began to have dizziness nausea vomiting fatigue over the last few days.  No fevers chills neck pain or shortness of breath   Headache      Home Medications Prior to Admission medications   Medication Sig Start Date End Date Taking? Authorizing Provider  albuterol  (VENTOLIN  HFA) 108 (90 Base) MCG/ACT inhaler Inhale 2 puffs into the lungs every 4 (four) hours as needed for wheezing or shortness of breath. 11/04/22   Alissa April, MD  amitriptyline  (ELAVIL ) 10 MG tablet Take 1 tablet (10 mg total) by mouth at bedtime. 07/04/17 12/31/17  Elene Griffes, MD  dicyclomine  (BENTYL ) 20 MG tablet Take 1 tablet (20 mg total) by mouth every 12 (twelve) hours as needed (abdominal cramping). 01/09/16   Carleton Cheek, PA-C  famotidine  (PEPCID ) 10 MG tablet Take 1 tablet (10 mg total) by mouth daily. 10/11/21   Adel Aden, PA-C  ondansetron  (ZOFRAN ) 4 MG tablet Take 1 tablet (4 mg total) by mouth every 8 (eight) hours as needed for nausea or vomiting. 10/11/21   Adel Aden, PA-C  pantoprazole  (PROTONIX ) 40 MG tablet TK 1 T PO D 05/26/17   [provider]  polyethylene glycol powder (GLYCOLAX /MIRALAX ) powder Take 17 g by mouth 2 (two) times daily. Until  daily soft stools  OTC 01/09/16   Carleton Cheek, PA-C  predniSONE  (DELTASONE ) 50 MG tablet Take 1 tablet (50 mg total) by mouth daily. 11/04/22   Alissa April, MD      Allergies    Beef-derived drug products    Review of Systems   Review of Systems  Neurological:  Positive for headaches.    Physical Exam Updated Vital Signs BP 114/82 (BP Location: Left Arm)   Pulse 78   Temp 98.2 F (36.8 C) (Oral)   Resp 16   Ht 5\' 5"  (1.651 m)   Wt 57.6 kg   LMP 08/16/2023 (Exact Date)   SpO2 97%   BMI 21.13 kg/m  Physical Exam Vitals and nursing note reviewed.  HENT:     Head: Normocephalic and atraumatic.  Eyes:     Pupils: Pupils are equal, round, and reactive to light.  Cardiovascular:     Rate and Rhythm: Normal rate and regular rhythm.  Pulmonary:     Effort: Pulmonary effort is normal.     Breath sounds: Normal breath sounds.  Abdominal:     Palpations: Abdomen is soft.     Tenderness: There is abdominal tenderness.     Comments: Mild lower abdominal tenderness no rebound rigidity guarding  Skin:    General: Skin is warm and dry.  Neurological:     Mental Status: She is alert.     Sensory: No sensory deficit.  Motor: No weakness.  Psychiatric:        Mood and Affect: Mood normal.     ED Results / Procedures / Treatments   Labs (all labs ordered are listed, but only abnormal results are displayed) Labs Reviewed  COMPREHENSIVE METABOLIC PANEL WITH GFR - Abnormal; Notable for the following components:      Result Value   Glucose, Bld 100 (*)    All other components within normal limits  URINALYSIS, W/ REFLEX TO CULTURE (INFECTION SUSPECTED) - Abnormal; Notable for the following components:   pH 8.5 (*)    Hgb urine dipstick TRACE (*)    Leukocytes,Ua SMALL (*)    Bacteria, UA MANY (*)    All other components within normal limits  URINE CULTURE  HCG, SERUM, QUALITATIVE  CBC WITH DIFFERENTIAL/PLATELET  MAGNESIUM    EKG None  Radiology No results  found.  Procedures Procedures    Medications Ordered in ED Medications  sodium chloride  0.9 % bolus 1,000 mL (1,000 mLs Intravenous New Bag/Given 08/26/23 1301)  ketorolac  (TORADOL ) 30 MG/ML injection 15 mg (15 mg Intravenous Given 08/26/23 1257)  metoCLOPramide (REGLAN) injection 10 mg (10 mg Intravenous Given 08/26/23 1257)    ED Course/ Medical Decision Making/ A&P Clinical Course as of 08/26/23 1513  Fri Aug 26, 2023  1335 Patient reports significant improvement in her symptoms after Reglan fluids Toradol .  Laboratory workup including pregnancy test is unremarkable.  UA does show some leukocytes and bacteria but given that she does not have urinary symptoms we will hold off on antibiotics now and send for culture.  She will follow-up with her OB/GYN regarding PCOS symptoms [MP]    Clinical Course User Index [MP] Sallyanne Creamer, DO                                 Medical Decision Making 23 year old female with history as above presenting for pelvic pain headache along with nausea vomiting going on for 3 weeks.  Last menstrual period was 2 weeks ago.  Some lower abdominal tenderness without rebound rigidity or guarding.  Low suspicion for acute intra-abdominal process such as appendicitis or diverticulitis given this been going on for 3 weeks.  Suspect most likely etiology of the lower abdominal pain would be related to her previously diagnosed PCOS.  Given constellation of symptoms will obtain laboratory workup to look for leukocytosis or electrolyte imbalance in the setting of vomiting.  Will give IV fluids, Toradol  and Reglan to see if that helps with her symptoms.  If pain does not improve or becomes more severe we will consider imaging at that time  Amount and/or Complexity of Data Reviewed Labs: ordered.  Risk Prescription drug management.           Final Clinical Impression(s) / ED Diagnoses Final diagnoses:  Acute nonintractable headache, unspecified headache type   Pelvic pain    Rx / DC Orders ED Discharge Orders     None         Sallyanne Creamer, DO 08/26/23 1513

## 2023-08-28 LAB — URINE CULTURE: Culture: <10000 — AB

## 2024-02-19 ENCOUNTER — Other Ambulatory Visit: Payer: Self-pay

## 2024-02-19 ENCOUNTER — Encounter (HOSPITAL_BASED_OUTPATIENT_CLINIC_OR_DEPARTMENT_OTHER): Payer: Self-pay

## 2024-02-19 ENCOUNTER — Emergency Department (HOSPITAL_BASED_OUTPATIENT_CLINIC_OR_DEPARTMENT_OTHER)
Admission: EM | Admit: 2024-02-19 | Discharge: 2024-02-19 | Disposition: A | Discharge status: Home / Self Care | Attending: Emergency Medicine | Admitting: Emergency Medicine

## 2024-02-19 ENCOUNTER — Emergency Department (HOSPITAL_BASED_OUTPATIENT_CLINIC_OR_DEPARTMENT_OTHER)

## 2024-02-19 DIAGNOSIS — R509 Fever, unspecified: Secondary | ICD-10-CM | POA: Diagnosis not present

## 2024-02-19 DIAGNOSIS — R519 Headache, unspecified: Secondary | ICD-10-CM | POA: Diagnosis not present

## 2024-02-19 DIAGNOSIS — R1084 Generalized abdominal pain: Secondary | ICD-10-CM | POA: Insufficient documentation

## 2024-02-19 DIAGNOSIS — R42 Dizziness and giddiness: Secondary | ICD-10-CM | POA: Diagnosis not present

## 2024-02-19 DIAGNOSIS — R350 Frequency of micturition: Secondary | ICD-10-CM | POA: Diagnosis not present

## 2024-02-19 LAB — URINALYSIS, MICROSCOPIC (REFLEX)

## 2024-02-19 LAB — COMPREHENSIVE METABOLIC PANEL WITH GFR
ALT: 13 U/L (ref 0–44)
AST: 18 U/L (ref 15–41)
Albumin: 4.5 g/dL (ref 3.5–5.0)
Alkaline Phosphatase: 51 U/L (ref 38–126)
Anion gap: 11 (ref 5–15)
BUN: 8 mg/dL (ref 6–20)
CO2: 24 mmol/L (ref 22–32)
Calcium: 9.1 mg/dL (ref 8.9–10.3)
Chloride: 102 mmol/L (ref 98–111)
Creatinine, Ser: 0.64 mg/dL (ref 0.44–1.00)
GFR, Estimated: >60 mL/min (ref >60)
Glucose, Bld: 107 mg/dL — ABNORMAL HIGH (ref 70–99)
Potassium: 3.5 mmol/L (ref 3.5–5.1)
Sodium: 136 mmol/L (ref 135–145)
Total Bilirubin: 0.6 mg/dL (ref 0.0–1.2)
Total Protein: 7.3 g/dL (ref 6.5–8.1)

## 2024-02-19 LAB — CBC WITH DIFFERENTIAL/PLATELET
Abs Immature Granulocytes: 0.02 K/uL (ref 0.00–0.07)
Basophils Absolute: 0 K/uL (ref 0.0–0.1)
Basophils Relative: 0 %
Eosinophils Absolute: 0 K/uL (ref 0.0–0.5)
Eosinophils Relative: 1 %
HCT: 40.2 % (ref 36.0–46.0)
Hemoglobin: 13.7 g/dL (ref 12.0–15.0)
Immature Granulocytes: 0 %
Lymphocytes Relative: 10 %
Lymphs Abs: 0.6 K/uL — ABNORMAL LOW (ref 0.7–4.0)
MCH: 31.6 pg (ref 26.0–34.0)
MCHC: 34.1 g/dL (ref 30.0–36.0)
MCV: 92.8 fL (ref 80.0–100.0)
Monocytes Absolute: 0.4 K/uL (ref 0.1–1.0)
Monocytes Relative: 6 %
Neutro Abs: 5.2 K/uL (ref 1.7–7.7)
Neutrophils Relative %: 83 %
Platelets: 251 K/uL (ref 150–400)
RBC: 4.33 MIL/uL (ref 3.87–5.11)
RDW: 12.8 % (ref 11.5–15.5)
WBC: 6.3 K/uL (ref 4.0–10.5)
nRBC: 0 % (ref 0.0–0.2)

## 2024-02-19 LAB — URINALYSIS, ROUTINE W REFLEX MICROSCOPIC
Bilirubin Urine: NEGATIVE
Glucose, UA: NEGATIVE mg/dL
Ketones, ur: NEGATIVE mg/dL
Leukocytes,Ua: NEGATIVE
Nitrite: NEGATIVE
Protein, ur: NEGATIVE mg/dL
Specific Gravity, Urine: 1.02 (ref 1.005–1.030)
pH: 7 (ref 5.0–8.0)

## 2024-02-19 LAB — RESP PANEL BY RT-PCR (RSV, FLU A&B, COVID)  RVPGX2
Influenza A by PCR: NEGATIVE
Influenza B by PCR: NEGATIVE
Resp Syncytial Virus by PCR: NEGATIVE
SARS Coronavirus 2 by RT PCR: NEGATIVE

## 2024-02-19 LAB — LIPASE, BLOOD: Lipase: 24 U/L (ref 11–51)

## 2024-02-19 LAB — HCG, SERUM, QUALITATIVE: Preg, Serum: NEGATIVE

## 2024-02-19 MED ORDER — SODIUM CHLORIDE 0.9 % IV BOLUS
1000.0000 mL | Freq: Once | INTRAVENOUS | Status: AC
  Administered 2024-02-19: 1000 mL via INTRAVENOUS

## 2024-02-19 MED ORDER — ALUM & MAG HYDROXIDE-SIMETH 200-200-20 MG/5ML PO SUSP
15.0000 mL | Freq: Once | ORAL | Status: AC
  Administered 2024-02-19: 15 mL via ORAL
  Filled 2024-02-19: qty 30

## 2024-02-19 MED ORDER — IOHEXOL 300 MG/ML  SOLN
80.0000 mL | Freq: Once | INTRAMUSCULAR | Status: AC | PRN
  Administered 2024-02-19: 80 mL via INTRAVENOUS

## 2024-02-19 MED ORDER — ONDANSETRON 4 MG PO TBDP: 4.0000 mg | ORAL_TABLET | Freq: Three times a day (TID) | ORAL | 0 refills | Status: AC | PRN

## 2024-02-19 MED ORDER — ONDANSETRON HCL 4 MG/2ML IJ SOLN
4.0000 mg | Freq: Once | INTRAMUSCULAR | Status: AC
  Administered 2024-02-19: 4 mg via INTRAVENOUS
  Filled 2024-02-19: qty 2

## 2024-02-19 MED ORDER — MORPHINE SULFATE (PF) 4 MG/ML IV SOLN
4.0000 mg | Freq: Once | INTRAVENOUS | Status: AC
  Administered 2024-02-19: 4 mg via INTRAVENOUS
  Filled 2024-02-19: qty 1

## 2024-02-19 MED ORDER — DICYCLOMINE HCL 20 MG PO TABS: 20.0000 mg | ORAL_TABLET | Freq: Two times a day (BID) | ORAL | 0 refills | Status: AC

## 2024-02-19 NOTE — ED Provider Notes (Signed)
 [place] [facility] AT [facility] Provider Note   CSN: [id number] Arrival date & time: 02/19/24  1743     Patient presents with: Abdominal Pain   Jessica Beltran is a 23 y.o. female.   23 year old female presents with complaint of headache, dizziness, fever and abdominal pain with nausea and vomiting and urinary frequency.  Symptoms started today, no known sick contacts.  Patient took Motrin  and Tylenol  without improvement in her pain.  She denies changes in bowel habits or vaginal discharge.  No prior abdominal surgeries.  No other complaints or concerns.       Prior to Admission medications   Medication Sig Start Date End Date Taking? Authorizing Provider  dicyclomine  (BENTYL ) 20 MG tablet Take 1 tablet (20 mg total) by mouth 2 (two) times daily. 02/19/24  Yes Beverley Leita LABOR, PA-C  ondansetron  (ZOFRAN -ODT) 4 MG disintegrating tablet Take 1 tablet (4 mg total) by mouth every 8 (eight) hours as needed for nausea or vomiting. 02/19/24  Yes Beverley Leita LABOR, PA-C  albuterol  (VENTOLIN  HFA) 108 (90 Base) MCG/ACT inhaler Inhale 2 puffs into the lungs every 4 (four) hours as needed for wheezing or shortness of breath. 11/04/22   Raford Lenis, MD  amitriptyline  (ELAVIL ) 10 MG tablet Take 1 tablet (10 mg total) by mouth at bedtime. 07/04/17 12/31/17  Leatrice Eric Cuba, MD  famotidine  (PEPCID ) 10 MG tablet Take 1 tablet (10 mg total) by mouth daily. 10/11/21   Ruthell Lonni FALCON, PA-C  pantoprazole  (PROTONIX ) 40 MG tablet TK 1 T PO D 05/26/17   [provider]  polyethylene glycol powder (GLYCOLAX /MIRALAX ) powder Take 17 g by mouth 2 (two) times daily. Until daily soft stools  OTC 01/09/16   Keith Sor, PA-C    Allergies: Bovine (beef) protein-containing drug products    Review of Systems Negative except as per HPI Updated Vital Signs BP 113/74 (BP Location: Right Arm)   Pulse 90   Temp 97.7 F (36.5 C) (Oral)   Resp (!) 22   Ht 5' 5  (1.651 m)   Wt 56.7 kg   SpO2 99%   BMI 20.80 kg/m   Physical Exam Vitals and nursing note reviewed.  Constitutional:      General: She is not in acute distress.    Appearance: She is well-developed. She is not diaphoretic.  HENT:     Head: Normocephalic and atraumatic.  Cardiovascular:     Rate and Rhythm: Normal rate and regular rhythm.     Heart sounds: Normal heart sounds.  Pulmonary:     Effort: Pulmonary effort is normal.     Breath sounds: Normal breath sounds.  Abdominal:     Palpations: Abdomen is soft.     Tenderness: There is generalized abdominal tenderness. There is no right CVA tenderness, left CVA tenderness, guarding or rebound.  Skin:    General: Skin is warm and dry.     Findings: No erythema or rash.  Neurological:     Mental Status: She is alert and oriented to person, place, and time.  Psychiatric:        Behavior: Behavior normal.     (all labs ordered are listed, but only abnormal results are displayed) Labs Reviewed  CBC WITH DIFFERENTIAL/PLATELET - Abnormal; Notable for the following components:      Result Value   Lymphs Abs 0.6 (*)    All other components within normal limits  COMPREHENSIVE METABOLIC PANEL WITH GFR - Abnormal; Notable for the following components:  Glucose, Bld 107 (*)    All other components within normal limits  URINALYSIS, ROUTINE W REFLEX MICROSCOPIC - Abnormal; Notable for the following components:   Hgb urine dipstick TRACE (*)    All other components within normal limits  URINALYSIS, MICROSCOPIC (REFLEX) - Abnormal; Notable for the following components:   Bacteria, UA RARE (*)    All other components within normal limits  RESP PANEL BY RT-PCR (RSV, FLU A&B, COVID)  RVPGX2  LIPASE, BLOOD  HCG, SERUM, QUALITATIVE    EKG: None  Radiology: CT ABDOMEN PELVIS W CONTRAST Result Date: 02/19/2024 EXAM: CT ABDOMEN AND PELVIS WITH CONTRAST 02/19/2024 08:27:29 PM TECHNIQUE: CT of the abdomen and pelvis was performed  with the administration of intravenous contrast. 80 mL of iohexol  (OMNIPAQUE ) 300 MG/ML solution was administered. Multiplanar reformatted images are provided for review. Automated exposure control, iterative reconstruction, and/or weight-based adjustment of the mA/kV was utilized to reduce the radiation dose to as low as reasonably achievable. COMPARISON: 11/30/2019 CLINICAL HISTORY: Abdominal pain, acute, nonlocalized FINDINGS: LOWER CHEST: No acute abnormality. LIVER: The liver is unremarkable. GALLBLADDER AND BILE DUCTS: Gallbladder is unremarkable. No biliary ductal dilatation. SPLEEN: 12 mm low-density lesion in the inferior tip of the spleen, likely cyst or hemangioma. PANCREAS: No acute abnormality. ADRENAL GLANDS: No acute abnormality. KIDNEYS, URETERS AND BLADDER: Punctate 1 mm nonobstructing stone in the mid pole of the right kidney. No stones in the ureters. No hydronephrosis. No perinephric or periureteral stranding. Urinary bladder is unremarkable. GI AND BOWEL: Stomach demonstrates no acute abnormality. There is no bowel obstruction. Normal appendix. PERITONEUM AND RETROPERITONEUM: No ascites. No free air. VASCULATURE: Aorta is normal in caliber. LYMPH NODES: No lymphadenopathy. REPRODUCTIVE ORGANS: No acute abnormality. BONES AND SOFT TISSUES: No acute osseous abnormality. No focal soft tissue abnormality. IMPRESSION: 1. No acute findings in the abdomen or pelvis. Electronically signed by: Franky Crease MD 02/19/2024 08:31 PM EST RP Workstation: [id number]     Procedures   Medications Ordered in the ED  alum & mag hydroxide-simeth (MAALOX/MYLANTA) 200-200-20 MG/5ML suspension 15 mL (has no administration in time range)  sodium chloride  0.9 % bolus 1,000 mL (0 mLs Intravenous Stopped 02/19/24 1946)  ondansetron  (ZOFRAN ) injection 4 mg (4 mg Intravenous Given 02/19/24 1840)  morphine  (PF) 4 MG/ML injection 4 mg (4 mg Intravenous Given 02/19/24 2008)  iohexol  (OMNIPAQUE ) 300 MG/ML solution 80  mL (80 mLs Intravenous Contrast Given 02/19/24 2021)                                    Medical Decision Making Amount and/or Complexity of Data Reviewed Labs: ordered. Radiology: ordered.  Risk OTC drugs. Prescription drug management.   This patient presents to the ED for concern of headache, abdominal pain, this involves an extensive number of treatment options, and is a complaint that carries with it a high risk of complications and morbidity.  The differential diagnosis includes viral illness, GERD, gastritis, appendicitis, pancreatitis   Co morbidities / Chronic conditions that complicate the patient evaluation  No significant history on file   Additional history obtained:  Additional history obtained from EMR External records from outside source obtained and reviewed including prior labs on file   Lab Tests:  I Ordered, and personally interpreted labs.  The pertinent results include: hCG negative.  CBC within normals.  CMP without significant findings.  Urinalysis with trace hemoglobin otherwise unremarkable.  Negative for COVID, flu, RSV.  Lipase normal.   Imaging Studies ordered:  I ordered imaging studies including CT abdomen pelvis I independently visualized and interpreted imaging which showed no acute process I agree with the radiologist interpretation   Problem List / ED Course / Critical interventions / Medication management  22 year old female presents with complaint of headache and dizziness earlier today with fever and abdominal pain, nausea, vomiting, urinary frequency.  Labs reassuring.  Repeat abdominal exam with persistent generalized discomfort.  CT abdomen pelvis without findings to explain patient's symptoms.  She is provided a small dose of morphine .  Pain improved although did have some upper abdominal burning which is provided with Maalox.  Discharged with Bentyl  and Zofran .  Recommend recheck with PCP.  Return to ER for worsening or concerning  symptoms.- I ordered medication including Zofran , IV fluids.  Persistent abdominal pain, provided with morphine .  Reports burning in upper stomach area, provided Maalox. Reevaluation of the patient after these medicines showed that the patient stable I have reviewed the patients home medicines and have made adjustments as needed   Social Determinants of Health:  Has PCP   Test / Admission - Considered:  Stable for discharge      Final diagnoses:  Generalized abdominal pain  Acute nonintractable headache, unspecified headache type    ED Discharge Orders          Ordered    dicyclomine  (BENTYL ) 20 MG tablet  2 times daily        02/19/24 2123    ondansetron  (ZOFRAN -ODT) 4 MG disintegrating tablet  Every 8 hours PRN        02/19/24 2123               Beverley Leita LABOR, PA-C 02/19/24 2127    Lenor Hollering, MD 02/19/24 2259

## 2024-02-19 NOTE — ED Triage Notes (Signed)
 Headache, nausea, vomiting, lower abdominal pain since yesterday 102 fever this AM, took tylenol  and ibuprofen .

## 2024-02-19 NOTE — Discharge Instructions (Addendum)
 Follow-up for recheck with your primary care provider. Return to emergency room for worsening or concerning symptoms.  Take Bentyl  as needed as prescribed for abdominal pain. Take Zofran  as needed prescribed for nausea and vomiting.
